# Patient Record
Sex: Male | Born: 2005 | Race: White | Hispanic: No | Marital: Single | Smoking: Never smoker
Health system: Southern US, Community
[De-identification: ages and names within clinical notes are randomized; demographics above are authoritative.]

## PROBLEM LIST (undated history)

## (undated) DIAGNOSIS — R159 Full incontinence of feces: Secondary | ICD-10-CM

## (undated) HISTORY — DX: Full incontinence of feces: R15.9

---
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
## 2024-01-15 DIAGNOSIS — F902 Attention-deficit hyperactivity disorder, combined type: Secondary | ICD-10-CM | POA: Diagnosis not present

## 2024-01-15 DIAGNOSIS — Z79899 Other long term (current) drug therapy: Secondary | ICD-10-CM | POA: Diagnosis not present

## 2024-04-22 DIAGNOSIS — Z9189 Other specified personal risk factors, not elsewhere classified: Secondary | ICD-10-CM | POA: Diagnosis not present

## 2024-04-22 DIAGNOSIS — R634 Abnormal weight loss: Secondary | ICD-10-CM | POA: Diagnosis not present

## 2024-04-22 DIAGNOSIS — R32 Unspecified urinary incontinence: Secondary | ICD-10-CM | POA: Diagnosis not present

## 2024-04-22 DIAGNOSIS — Z0000 Encounter for general adult medical examination without abnormal findings: Secondary | ICD-10-CM | POA: Diagnosis not present

## 2024-04-22 DIAGNOSIS — F902 Attention-deficit hyperactivity disorder, combined type: Secondary | ICD-10-CM | POA: Diagnosis not present

## 2024-07-24 DIAGNOSIS — Z23 Encounter for immunization: Secondary | ICD-10-CM | POA: Diagnosis not present

## 2024-07-24 DIAGNOSIS — R634 Abnormal weight loss: Secondary | ICD-10-CM | POA: Diagnosis not present

## 2024-07-24 DIAGNOSIS — L700 Acne vulgaris: Secondary | ICD-10-CM | POA: Diagnosis not present

## 2024-07-24 DIAGNOSIS — F902 Attention-deficit hyperactivity disorder, combined type: Secondary | ICD-10-CM | POA: Diagnosis not present
